# Patient Record
Sex: Male | Born: 1970 | Race: White | Hispanic: No | Marital: Single | Smoking: Current every day smoker
Health system: Southern US, Community
[De-identification: ages and names within clinical notes are randomized; demographics above are authoritative.]

## PROBLEM LIST (undated history)

## (undated) DIAGNOSIS — K429 Umbilical hernia without obstruction or gangrene: Secondary | ICD-10-CM

## (undated) HISTORY — DX: Umbilical hernia without obstruction or gangrene: K42.9

---
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
## 2009-07-10 ENCOUNTER — Telehealth: Payer: Self-pay | Admitting: Family Medicine

## 2009-08-26 ENCOUNTER — Telehealth: Payer: Self-pay | Admitting: Family Medicine

## 2009-08-26 ENCOUNTER — Encounter: Payer: Self-pay | Admitting: Family Medicine

## 2009-09-07 ENCOUNTER — Encounter (INDEPENDENT_AMBULATORY_CARE_PROVIDER_SITE_OTHER): Payer: Self-pay | Admitting: *Deleted

## 2009-09-07 ENCOUNTER — Ambulatory Visit: Payer: Self-pay | Admitting: Family Medicine

## 2009-09-07 DIAGNOSIS — F17200 Nicotine dependence, unspecified, uncomplicated: Secondary | ICD-10-CM

## 2009-09-07 DIAGNOSIS — K649 Unspecified hemorrhoids: Secondary | ICD-10-CM | POA: Insufficient documentation

## 2009-09-07 DIAGNOSIS — M549 Dorsalgia, unspecified: Secondary | ICD-10-CM | POA: Insufficient documentation

## 2009-09-09 LAB — CONVERTED CEMR LAB
BUN: 14 mg/dL (ref 6–23)
Basophils Absolute: 0.1 10*3/uL (ref 0.0–0.1)
Basophils Relative: 0.8 % (ref 0.0–3.0)
Bilirubin, Direct: 0.1 mg/dL (ref 0.0–0.3)
CO2: 32 meq/L (ref 19–32)
Chloride: 105 meq/L (ref 96–112)
Cholesterol: 203 mg/dL — ABNORMAL HIGH (ref 0–200)
Creatinine, Ser: 1 mg/dL (ref 0.4–1.5)
Direct LDL: 94.2 mg/dL
Eosinophils Absolute: 0.2 10*3/uL (ref 0.0–0.7)
Glucose, Bld: 92 mg/dL (ref 70–99)
MCHC: 35.2 g/dL (ref 30.0–36.0)
MCV: 92.5 fL (ref 78.0–100.0)
Monocytes Absolute: 0.7 10*3/uL (ref 0.1–1.0)
Neutrophils Relative %: 50 % (ref 43.0–77.0)
Platelets: 151 10*3/uL (ref 150.0–400.0)
RDW: 13.7 % (ref 11.5–14.6)
Total Bilirubin: 0.5 mg/dL (ref 0.3–1.2)
Total Protein: 6.5 g/dL (ref 6.0–8.3)

## 2010-05-04 NOTE — Assessment & Plan Note (Signed)
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 of borderline chol -- ? 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 if it helped  not really bothering him -- a little blood streak now and then  no abd pain or other symptoms  never had a colonoscopy they do pop out occasionally -- ? 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 last Td -- probably 10-11y  is interested in health mt  is due for DOT physical due in nov 2012   shoulder/ back stiffness  most of the time is mild  sometimes more of a problem  lifts heavy flatbeds  2 weeks ago coughed and pulled something in neck and back   not a lot of opportunity to stretch   smokes 1/2-1ppd usually not ready to quit  understands the risks   limitations to eating healthy on the road    Allergies (verified): No Known Drug Allergies  Past History:  Family History: Last updated: 09/07/2009 mother -- is Terald Sleeper father-  alcoholism  GF- several MI and strokes - older , and a type of blood cancer and skin cancer (had to have transfusions)- died  GM may have had cancer- ? 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 Eyes:  Denies blurring and eye irritation. CV:  Denies chest pain or discomfort, palpitations, shortness  of breath with exertion, and swelling of feet. Resp:  Denies cough, shortness of breath, and wheezing. GI:  Denies abdominal pain, change in bowel habits, indigestion, and nausea. GU:  Denies hematuria and urinary frequency. MS:  Complains of mid back pain; denies joint pain, joint redness, and joint swelling. Derm:  Denies itching, lesion(s), poor wound healing, and rash. Neuro:  Denies numbness and tingling. Psych:  Denies anxiety and depression. Endo:  Denies cold intolerance, excessive thirst, excessive urination, and heat intolerance. Heme:  Denies abnormal bruising.   Impression & Recommendations:  Problem # 1:  BACK PAIN (ICD-724.5) Assessment New with heavy lifting - intermittent disc stretches before / after driving  watch for numbness/ weakness or worsening   Problem # 2:  HEMORRHOIDS (ICD-455.6) Assessment: New not currently bothering him sounds like internal disc keeping stools soft- avoid straining    if flared - will call for px for anusol hc suppositories- these may be easier to use than cream  f/u prn  Problem # 3:  Preventive Health Care (ICD-V70.0) labs now for PE in nov  Td today  Problem # 4:  TOBACCO USE (ICD-305.1) Assessment: New discussed in detail risks of smoking, and possible outcomes including COPD, vascular dz, cancer and also respiratory infections/sinus problems  pt voiced understanding  he stated not ready to quit yet  knows he can ask for help  Other Orders: Venipuncture (04540) TLB-Lipid Panel (80061-LIPID) TLB-BMP (Basic Metabolic Panel-BMET) (80048-METABOL) TLB-CBC Platelet - w/Differential (85025-CBCD) TLB-Hepatic/Liver Function Pnl (80076-HEPATIC) TLB-TSH (Thyroid Stimulating Hormone) (84443-TSH) TD Toxoids IM 7 YR + (98119) Admin 1st Vaccine (14782) Admin 1st Vaccine Horticulturist, commercial) [phone])  Patient Instructions: 1)  think about quitting smoking  2)  tetnus shot today  3)  labs today for wellness and cholesterol  4)  schedule physical exam in november 5)  let me know if hemorroids worsen  6)  you can raise your HDL (good cholesterol) by increasing exercise and eating omega 3 fatty acid supplement like fish oil or flax seed oil over the counter 7)  you can lower LDL (bad cholesterol) by limiting saturated fats in diet like red meat, fried foods, egg yolks, fatty breakfast meats, high fat dairy products and shellfish  8)  I will look for some back stretching protocol and mail it to you   Prior Medications: None Current Allergies (reviewed today): No known allergies    Tetanus/Td Vaccine    Vaccine Type: Td    Site: left deltoid    Mfr: Sanofi Pasteur    Dose: 0.5 ml    Route: IM    Given by: Lowella Petties CMA    Exp. Date: 02/17/2011    Lot #: [id number]    VIS given: 02/20/07 version given September 07, 2009.   Appended Document: NEW PT TO ESTABLISH WITH DR Kortney Schoenfelder PER DR Lillien Petronio/[place]     Allergies: No Known Drug Allergies  Physical  Exam  General:  overweight but generally well appearing  Head:  normocephalic, atraumatic, and no abnormalities observed.   Eyes:  vision grossly intact, pupils equal, pupils round, and pupils reactive to light.   Ears:  R ear normal and L ear normal.  - scant cerumen Nose:  no nasal discharge.   Mouth:  pharynx pink and moist.   Neck:  supple with full rom and no masses or thyromegally, no JVD or carotid bruit  Chest Wall:  No deformities, masses, tenderness or gynecomastia noted. Lungs:  mildly distant bs no rales or wheeze  Abdomen:  Bowel sounds positive,abdomen soft and non-tender without masses, organomegaly or hernias noted. Msk:  no neck or spinal tenderness today some spasm in bilat trap area  nl rom neck - no crepitice  no pain around either scapula  nl rom TS  pt is asymptomatic today Pulses:  R and L carotid,radial,femoral,dorsalis pedis and posterior tibial pulses are full and equal bilaterally Extremities:  No clubbing, cyanosis, edema, or deformity noted with normal full range of motion of all joints.   Neurologic:  strength normal in all extremities, sensation intact to light touch, gait normal, and DTRs symmetrical and normal.   Skin:  Intact without suspicious lesions or rashes Cervical Nodes:  No lymphadenopathy noted Psych:  normal affect, talkative and pleasant

## 2010-05-04 NOTE — Progress Notes (Signed)
Summary: Question about new pt appt  Phone Note Call from Patient Call back at [phone] or [phone]   Caller: Spouse Call For: Dr Milinda Antis Summary of Call: Pt's wife said she spoke with you and you agreed to see her husband who is a truck driver with a limited schedule.  Pt's wife wanted to confirm that you would see her husband as a new pt on a Monday. Please advise.  Initial call taken by: Lewanda Rife LPN,  July 10, 2009 3:21 PM  Follow-up for Phone Call        any 30 minutes you can put together is fine  Follow-up by: Judith Part MD,  July 10, 2009 3:43 PM  Additional Follow-up for Phone Call Additional follow up Details #1::        Patient's wife  notified as instructed by telephone.  Pt's wife made appt for 09/07/09 at 10:30am.[facility] LPN  July 11, 8411 4:47 PM

## 2010-05-04 NOTE — Progress Notes (Signed)
Summary: needs not for work  Phone Note Call from Patient Call back at [employer] [phone]   Caller: Patient Call For: Dr. 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 He has very limited time off and his work is requiring that he have a note faxed to his work office stating that he has an appt. on that day to guarantee him the time off. His work fax # is [phone]. The note needs to have the date and time of appt.  Initial call taken by: Melody Comas,  Aug 26, 2009 10:39 AM  Follow-up for Phone Call        note done  Follow-up by: Judith Part MD,  Aug 26, 2009 11:29 AM  Additional Follow-up for Phone Call Additional follow up Details #1::        Letter faxed to [phone] as instructed. Left message for patient to call back. Lewanda Rife LPN  Aug 26, 2009 2:40 PM   Patient's wife notified as instructed by telephone. Lewanda Rife LPN  Aug 26, 2009 4:25 PM

## 2010-05-04 NOTE — Letter (Signed)
Summary: generic letter  [facility]  [address] [place], [place] [address]   Phone: [phone]  Fax: [phone]               09/07/2009  RYZEN DEADY [address] [place], [place]  [address]    Dear, Mr. Olden,  We have scheduled your next appointment with Dr. Roxy Manns for Monday, February 08, 2010 at 10:30am.  Please arrive 15 minutes prior to your appointment time.       Sincerely, Daine Gip

## 2010-05-04 NOTE — Letter (Signed)
Summary: Out of Work  [employer] at [facility]  [address] [place], [place] [address]   Phone: [phone]  Fax: [phone]    Aug 26, 2009   Employee:  Nicholas Hampton    To Whom It May Concern:   For Medical reasons, please excuse the above named employee from work for the following dates:  Start:   September 07, 2009   End:   pt has doctor appt with Zhavia Cunanan at 10:30 am that day  he can return to work after appt   If you need additional information, please feel free to contact our office.         Sincerely,    Judith Part MD

## 2012-04-04 DIAGNOSIS — K429 Umbilical hernia without obstruction or gangrene: Secondary | ICD-10-CM

## 2012-04-04 HISTORY — PX: HERNIA REPAIR: SHX51

## 2012-04-04 HISTORY — DX: Umbilical hernia without obstruction or gangrene: K42.9

## 2012-12-24 ENCOUNTER — Encounter: Payer: Self-pay | Admitting: *Deleted

## 2013-01-09 ENCOUNTER — Ambulatory Visit (INDEPENDENT_AMBULATORY_CARE_PROVIDER_SITE_OTHER): Payer: BC Managed Care – PPO | Admitting: General Surgery

## 2013-01-09 ENCOUNTER — Encounter: Payer: Self-pay | Admitting: General Surgery

## 2013-01-09 VITALS — BP 140/100 | HR 78 | Resp 18 | Ht 69.0 in | Wt 265.0 lb

## 2013-01-09 DIAGNOSIS — K429 Umbilical hernia without obstruction or gangrene: Secondary | ICD-10-CM

## 2013-01-09 NOTE — Patient Instructions (Addendum)
Hernia, Surgical Repair A hernia occurs when an internal organ pushes out through a weak spot in the belly (abdominal) wall muscles. Hernias commonly occur in the groin and around the navel. Hernias often can be pushed back into place (reduced). Most hernias tend to get worse over time. Problems occur when abdominal contents get stuck in the opening (incarcerated hernia). The blood supply gets cut off (strangulated hernia). This is an emergency and needs surgery. Otherwise, hernia repair can be an elective procedure. This means you can schedule this at your convenience when an emergency is not present. Because complications can occur, if you decide to repair the hernia, it is best to do it soon. When it becomes an emergency procedure, there is increased risk of complications after surgery. CAUSES   Heavy lifting.  Obesity.  Prolonged coughing.  Straining to move your bowels.  Hernias can also occur through a cut (incision) by a surgeonafter an abdominal operation. HOME CARE INSTRUCTIONS Before the repair:  Bed rest is not required. You may continue your normal activities, but avoid heavy lifting (more than 10 pounds) or straining. Cough gently. If you are a smoker, it is best to stop. Even the best hernia repair can break down with the continual strain of coughing.  Do not wear anything tight over your hernia. Do not try to keep it in with an outside bandage or truss. These can damage abdominal contents if they are trapped in the hernia sac.  Eat a normal diet. Avoid constipation. Straining over long periods of time to have a bowel movement will increase hernia size. It also can breakdown repairs. If you cannot do this with diet alone, laxatives or stool softeners may be used. PRIOR TO SURGERY, SEEK IMMEDIATE MEDICAL CARE IF: You have problems (symptoms) of a trapped (incarcerated) hernia. Symptoms include:  An oral temperature above 102 F (38.9 C) develops, or as your caregiver  suggests.  Increasing abdominal pain.  Feeling sick to your stomach(nausea) and vomiting.  You stop passing gas or stool.  The hernia is stuck outside the abdomen, looks discolored, feels hard, or is tender.  You have any changes in your bowel habits or in the hernia that is unusual for you. LET YOUR CAREGIVERS KNOW ABOUT THE FOLLOWING:  Allergies.  Medications taken including herbs, eye drops, over the counter medications, and creams.  Use of steroids (by mouth or creams).  Family or personal history of problems with anesthetics or Novocaine.  Possibility of pregnancy, if this applies.  Personal history of blood clots (thrombophlebitis).  Family or personal history of bleeding or blood problems.  Previous surgery.  Other health problems. BEFORE THE PROCEDURE You should be present 1 hour prior to your procedure, or as directed by your caregiver.  AFTER THE PROCEDURE After surgery, you will be taken to the recovery area. A nurse will watch and check your progress there. Once you are awake, stable, and taking fluids well, you will be allowed to go home as long as there are no problems. Once home, an ice pack (wrapped in a light towel) applied to your operative site may help with discomfort. It may also keep the swelling down. Do not lift anything heavier than 10 pounds (4.55 kilograms). Take showers not baths. Do not drive while taking narcotics. Follow instructions as suggested by your caregiver.  SEEK IMMEDIATE MEDICAL CARE IF: After surgery:  There is redness, swelling, or increasing pain in the wound.  There is pus coming from the wound.  There is  drainage from a wound lasting longer than 1 day.  An unexplained oral temperature above 102 F (38.9 C) develops.  You notice a foul smell coming from the wound or dressing.  There is a breaking open of a wound (edged not staying together) after the sutures have been removed.  You notice increasing pain in the shoulders  (shoulder strap areas).  You develop dizzy episodes or fainting while standing.  You develop persistent nausea or vomiting.  You develop a rash.  You have difficulty breathing.  You develop any reaction or side effects to medications given. MAKE SURE YOU:   Understand these instructions.  Will watch your condition.  Will get help right away if you are not doing well or get worse. Document Released: 09/14/2000 Document Revised: 06/13/2011 Document Reviewed: 08/07/2007 [facility] Patient Information 2014 [place], [place].  Patient's surgery has been scheduled for 01-14-13 at [facility].

## 2013-01-09 NOTE — Progress Notes (Signed)
Patient ID: Nicholas Hampton, male   DOB: 02/23/1971, 42 y.o.   MRN: [id number]  Chief Complaint  Patient presents with  . Other    umbilical hernia    HPI Nicholas Hampton is a 42 y.o. male here today for an evaluation of an umbilical hernia. Patient noticed this area about 5 months ago. He states it is not getting significantly bigger. No difficulties with daily activities. No known trauma.  HPI  History reviewed. No pertinent past medical history.  Past Surgical History  Procedure Laterality Date  . Appendectomy  1990    No family history on file.  Social History History  Substance Use Topics  . Smoking status: Current Every Day Smoker -- 1.00 packs/day for 26 years  . Smokeless tobacco: Never Used  . Alcohol Use: Yes    No Known Allergies  No current outpatient prescriptions on file.   No current facility-administered medications for this visit.    Review of Systems Review of Systems  Constitutional: Negative.   Respiratory: Negative.   Cardiovascular: Negative.     Blood pressure 140/100, pulse 78, resp. rate 18, height 5\' 9"  (1.753 m), weight 265 lb (120.203 kg).  Physical Exam Physical Exam  Constitutional: He is oriented to person, place, and time. He appears well-developed and well-nourished.  Eyes: No scleral icterus.  Cardiovascular: Normal rate, regular rhythm and normal heart sounds.   Pulmonary/Chest: Breath sounds normal.  Abdominal: Soft. Normal appearance and bowel sounds are normal. There is no tenderness. A hernia (umbilical hernia ) is present.  Reducible umbilical hernia  1.5 cm fascial defect.    Lymphadenopathy:    He has no cervical adenopathy.  Neurological: He is alert and oriented to person, place, and time.  Skin: Skin is warm and dry.    Data Reviewed None   Assessment    Umbilical hernia    Plan    Considering the patient's young age and strenuous activity, elective repair is been recommended. This will likely be  a primary repair, although the possibility of prosthetic mesh utilization was reviewed.    Patient's surgery has been scheduled for 01-14-13 at [facility].   Earline Mayotte 01/10/2013, 1:10 PM

## 2013-01-10 ENCOUNTER — Other Ambulatory Visit: Payer: Self-pay | Admitting: General Surgery

## 2013-01-10 ENCOUNTER — Encounter: Payer: Self-pay | Admitting: General Surgery

## 2013-01-10 DIAGNOSIS — K429 Umbilical hernia without obstruction or gangrene: Secondary | ICD-10-CM

## 2013-01-14 ENCOUNTER — Ambulatory Visit: Payer: Self-pay | Admitting: General Surgery

## 2013-01-14 DIAGNOSIS — K429 Umbilical hernia without obstruction or gangrene: Secondary | ICD-10-CM

## 2013-01-15 ENCOUNTER — Encounter: Payer: Self-pay | Admitting: General Surgery

## 2013-01-15 ENCOUNTER — Telehealth: Payer: Self-pay | Admitting: *Deleted

## 2013-01-15 NOTE — Telephone Encounter (Signed)
Pt called and said he had hernia surgery yesterday and just wanted to ask a question about dressing change.

## 2013-01-15 NOTE — Telephone Encounter (Signed)
Discussed dressing care, pt agrees.

## 2013-01-23 ENCOUNTER — Encounter: Payer: Self-pay | Admitting: General Surgery

## 2013-01-23 ENCOUNTER — Ambulatory Visit (INDEPENDENT_AMBULATORY_CARE_PROVIDER_SITE_OTHER): Payer: BC Managed Care – PPO | Admitting: General Surgery

## 2013-01-23 VITALS — BP 140/70 | Ht 69.0 in | Wt 266.0 lb

## 2013-01-23 DIAGNOSIS — K429 Umbilical hernia without obstruction or gangrene: Secondary | ICD-10-CM

## 2013-01-23 NOTE — Progress Notes (Signed)
Patient ID: Nicholas Hampton, male   DOB: 10-15-1970, 42 y.o.   MRN: [id number]  Chief Complaint  Patient presents with  . Routine Post Op    umbilical henira    HPI Nicholas Hampton is a 42 y.o. male here today for his post op umbilical hernia repair done 01/15/13. Patient state he is doing well.  HPI  Past Medical History  Diagnosis Date  . Umbilical hernia 2014    Past Surgical History  Procedure Laterality Date  . Appendectomy  1990  . Hernia repair  2014    umbical     History reviewed. No pertinent family history.  Social History History  Substance Use Topics  . Smoking status: Current Every Day Smoker -- 1.00 packs/day for 26 years  . Smokeless tobacco: Never Used  . Alcohol Use: Yes    No Known Allergies  Current Outpatient Prescriptions  Medication Sig Dispense Refill  . HYDROcodone-acetaminophen (NORCO/VICODIN) 5-325 MG per tablet Take 1 tablet by mouth as needed.       No current facility-administered medications for this visit.    Review of Systems Review of Systems  Constitutional: Negative.   Respiratory: Negative.   Cardiovascular: Negative.     Blood pressure 140/70, height 5\' 9"  (1.753 m), weight 266 lb (120.657 kg).  Physical Exam Physical Exam  Constitutional: He is oriented to person, place, and time. He appears well-developed and well-nourished.  Abdominal:  Well healing incision site.   Genitourinary: Rectal exam shows external hemorrhoid.  Neurological: He is alert and oriented to person, place, and time.  Skin: Skin is warm and dry.    Data Reviewed None.  Assessment    Good recovery post umbilical hernia repair. External hemorrhoids.     Plan    Topical Anusol cream was recommended in regards to hemorrhoids. 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 We'll plan for reassessment in 3 weeks and anticipated to return to work  at that time. Neosporin to be applied to the wound to keep the area soft.Nicholas Hampton 01/23/2013, 9:28 PM

## 2013-01-23 NOTE — Patient Instructions (Addendum)
Patient to return in 3 weeks. The patient is advised to use preparation h for hemorrhoids.

## 2013-02-07 ENCOUNTER — Other Ambulatory Visit: Payer: Self-pay

## 2013-02-14 ENCOUNTER — Ambulatory Visit (INDEPENDENT_AMBULATORY_CARE_PROVIDER_SITE_OTHER): Payer: BC Managed Care – PPO | Admitting: General Surgery

## 2013-02-14 VITALS — BP 138/72 | HR 66 | Resp 14 | Ht 69.0 in | Wt 272.0 lb

## 2013-02-14 DIAGNOSIS — K429 Umbilical hernia without obstruction or gangrene: Secondary | ICD-10-CM

## 2013-02-14 NOTE — Patient Instructions (Signed)
Proper lifting techniques reviewed. Patient to return as needed.  

## 2013-02-14 NOTE — Progress Notes (Signed)
Patient ID: Nicholas Hampton, male   DOB: [birth year], 42 y.o.   MRN: [id number]  Chief Complaint  Patient presents with  . Routine Post Op    umbilical hernia repair    HPI Nicholas Hampton is a 42 y.o. male here today for his post op umbilical hernia repair done on 01/15/13. Patient state he is still having pain above the umbilical area, when he pushes on it the pain goes away. 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 These may way up to 100 pounds.  The area of tendernessIs just superior to the umbilical skin fold.  HPI  Past Medical History  Diagnosis Date  . Umbilical hernia 2014    Past Surgical History  Procedure Laterality Date  . Appendectomy  1990  . Hernia repair  2014    umbical     No family history on file.  Social History History  Substance Use Topics  . Smoking status: Current Every Day Smoker -- 1.00 packs/day for 26 years  . Smokeless tobacco: Never Used  . Alcohol Use: Yes    No Known Allergies  Current Outpatient Prescriptions  Medication Sig Dispense Refill  . HYDROcodone-acetaminophen (NORCO/VICODIN) 5-325 MG per tablet Take 1 tablet by mouth as needed.       No current facility-administered medications for this visit.    Review of Systems Review of Systems  Constitutional: Negative.   Respiratory: Negative.   Cardiovascular: Negative.     Blood pressure 138/72, pulse 66, resp. rate 14, height 5\' 9"  (1.753 m), weight 272 lb (123.378 kg).  Physical Exam Physical Exam  Constitutional: He is oriented to person, place, and time. He appears well-developed and well-nourished.  Eyes: No scleral icterus.  Cardiovascular: Normal rate, regular rhythm and normal heart sounds.   Abdominal: Soft. Normal appearance and bowel sounds are normal.  Umbilical area is healing well.   Lymphadenopathy:    He has no cervical adenopathy.  Neurological: He is alert and oriented to  person, place, and time.  Skin: Skin is warm and dry.  Area of tenderness is along the suture line from the primary repair. No induration, thickening or evidence of infection.  Data Reviewed None.  Assessment    Doing well status post umbilical hernia repair.     Plan    Considering the strenuous lifting required as part of his routine job responsibilities, we'll plan on him working on improving his endurance and flexibility prior to returning to work. I anticipate he'll be able to return to work the first week of December.        Nicholas Hampton 02/14/2013, 9:22 PM

## 2013-09-27 ENCOUNTER — Ambulatory Visit: Payer: Self-pay

## 2014-06-22 ENCOUNTER — Ambulatory Visit: Payer: Self-pay | Admitting: Emergency Medicine

## 2014-07-25 NOTE — Op Note (Signed)
PATIENT NAME:  Nicholas BoydenQUAKENBUSH, Dyon S MR#:  [id number] DATE OF BIRTH:  12-Jan-1971  DATE OF PROCEDURE:  01/15/2013  PREOPERATIVE DIAGNOSIS: Umbilical hernia.   POSTOPERATIVE DIAGNOSIS: Umbilical hernia.   OPERATIVE PROCEDURE: Repair of umbilical hernia.   SURGEON: Donnalee CurryJeffrey Meriah Shands, MD   ANESTHESIA: General by LMA, under Dr. Pernell DupreAdams, Marcaine 0.5% plain, 30 mL local infiltration, Toradol 30 mg.   ESTIMATED BLOOD LOSS: Minimal.   CLINICAL NOTE: This 44 year old male has developed a symptomatic and enlarging umbilical hernia. Fascial defect was estimated at less than 2 cm. He was felt to be a candidate for elective repair.   OPERATIVE NOTE: With the patient under adequate general anesthesia and hair previously removed with clippers, the abdomen was prepped with ChloraPrep and draped. He had previously received Kefzol intravenously in the event mesh repair was required. The infraumbilical skin was incised sharply and the remaining dissection completed with sharp dissection, electrocautery. The hernia sac was dissected free of the overlying skin, excised at the fascial level and discarded. The undersurface of the fascia was cleared, and interrupted 0 Surgilon sutures were placed under direct vision and subsequently tied sequentially. The umbilical skin was tacked to the fascia with 3-0 Vicryl. The adipose layer was approximated with a running 3-0 Vicryl. The skin was closed with running 4-0 Vicryl subcuticular suture. Benzoin, Steri-Strips, Telfa, Tegaderm dressings were applied.   The patient tolerated the procedure well and was taken to the recovery room in stable condition.  ____________________________ Earline MayotteJeffrey W. Karstyn Birkey, MD jwb:cg D: 01/15/2013 07:29:18 ET T: 01/15/2013 07:44:06 ET JOB#: [id number]  cc: Earline MayotteJeffrey W. Kriston Pasquarello, MD, <Dictator> Canisha Issac Brion AlimentW Baldwin Racicot MD ELECTRONICALLY SIGNED 01/16/2013 8:41

## 2014-10-25 IMAGING — CR DG KNEE COMPLETE 4+V*L*
4 series · 4 of 4 positions shown · non-contrast
Comparison: None.

CLINICAL DATA: Knee and hip pain.

EXAM:
LEFT KNEE - COMPLETE 4+ VIEW

[knee ap]
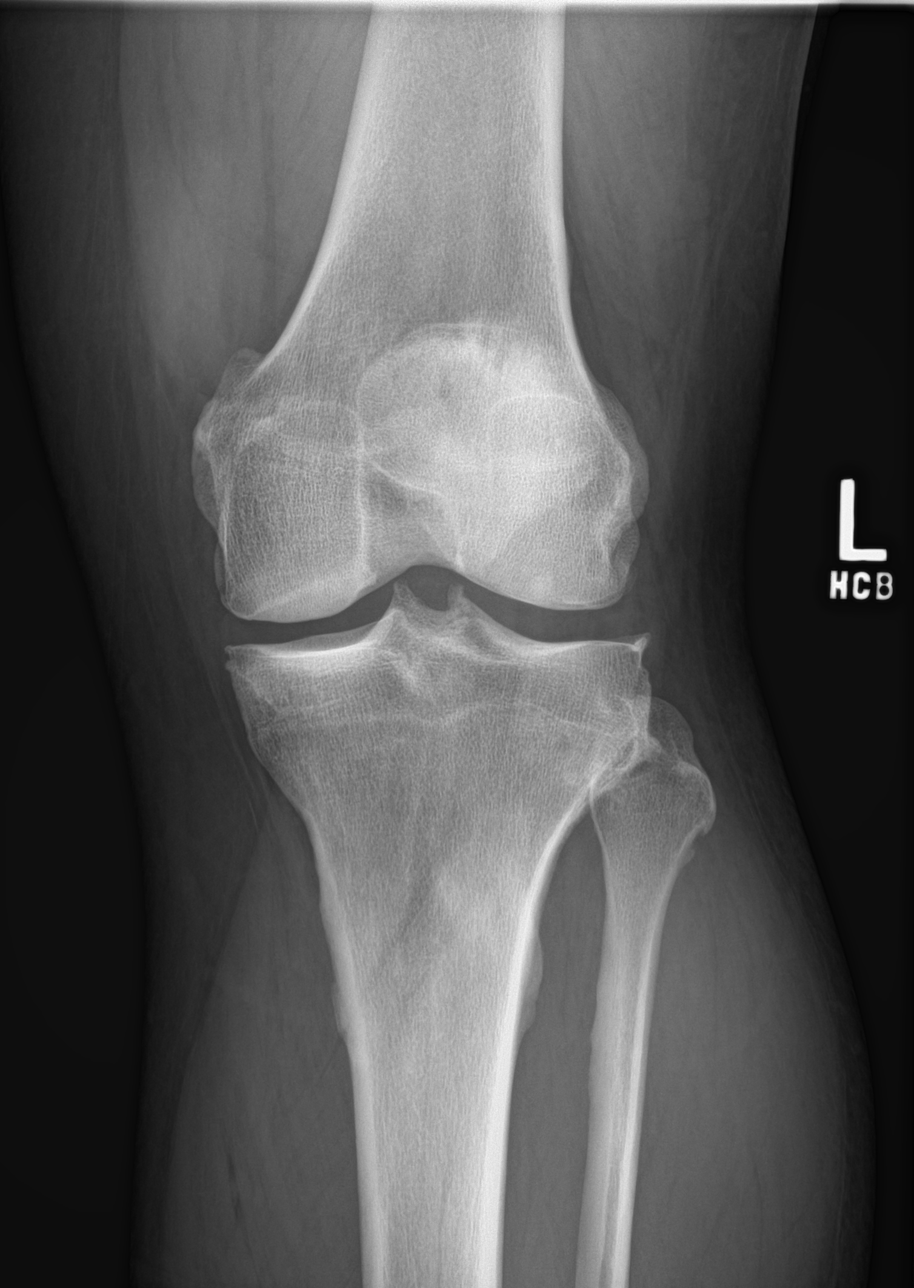

[knee obl (1 of 2)]
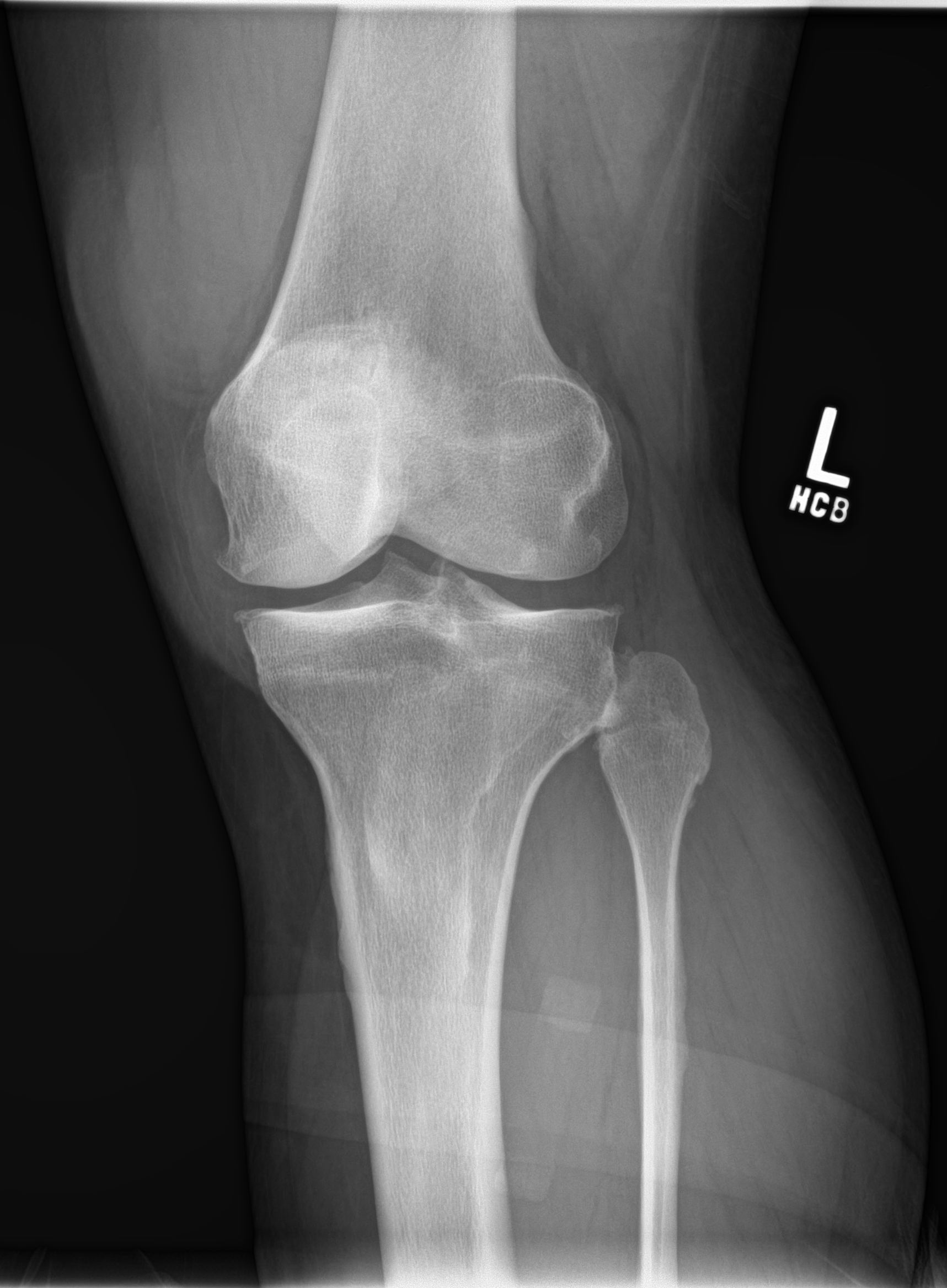

[knee obl (2 of 2)]
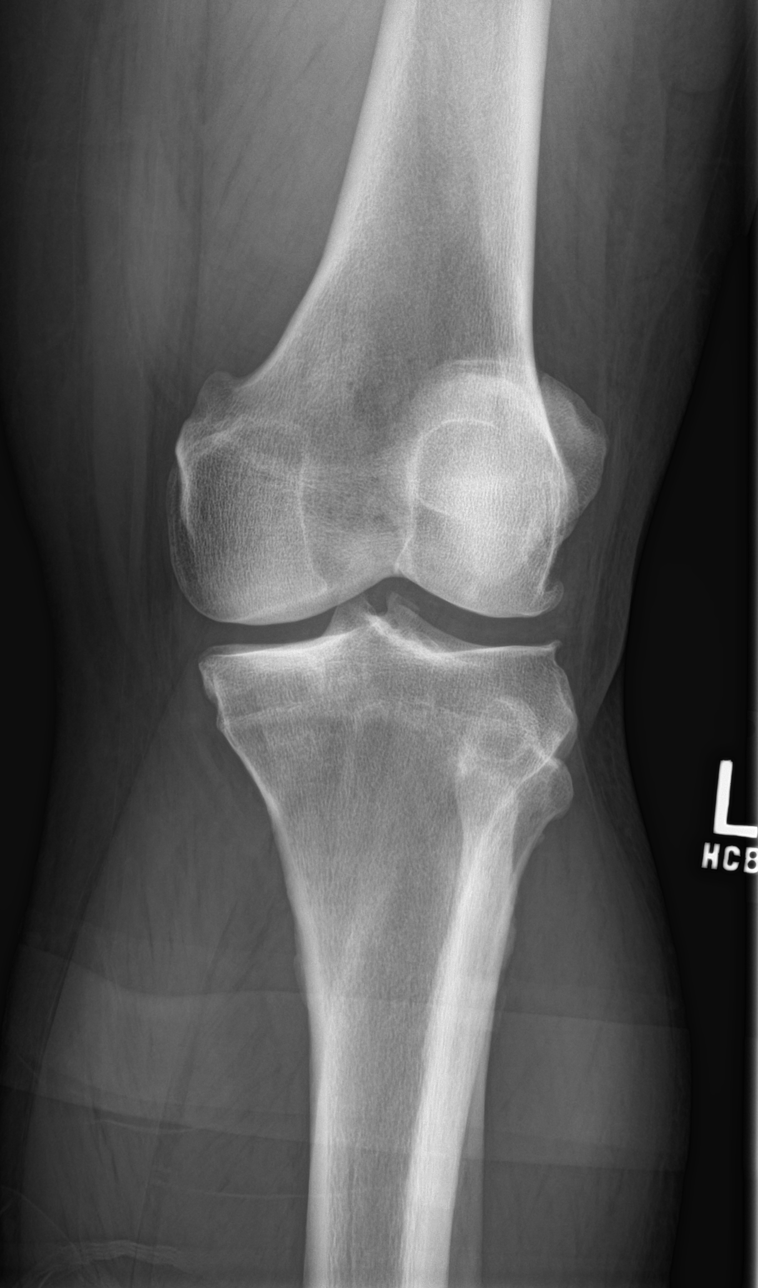

[knee lat]
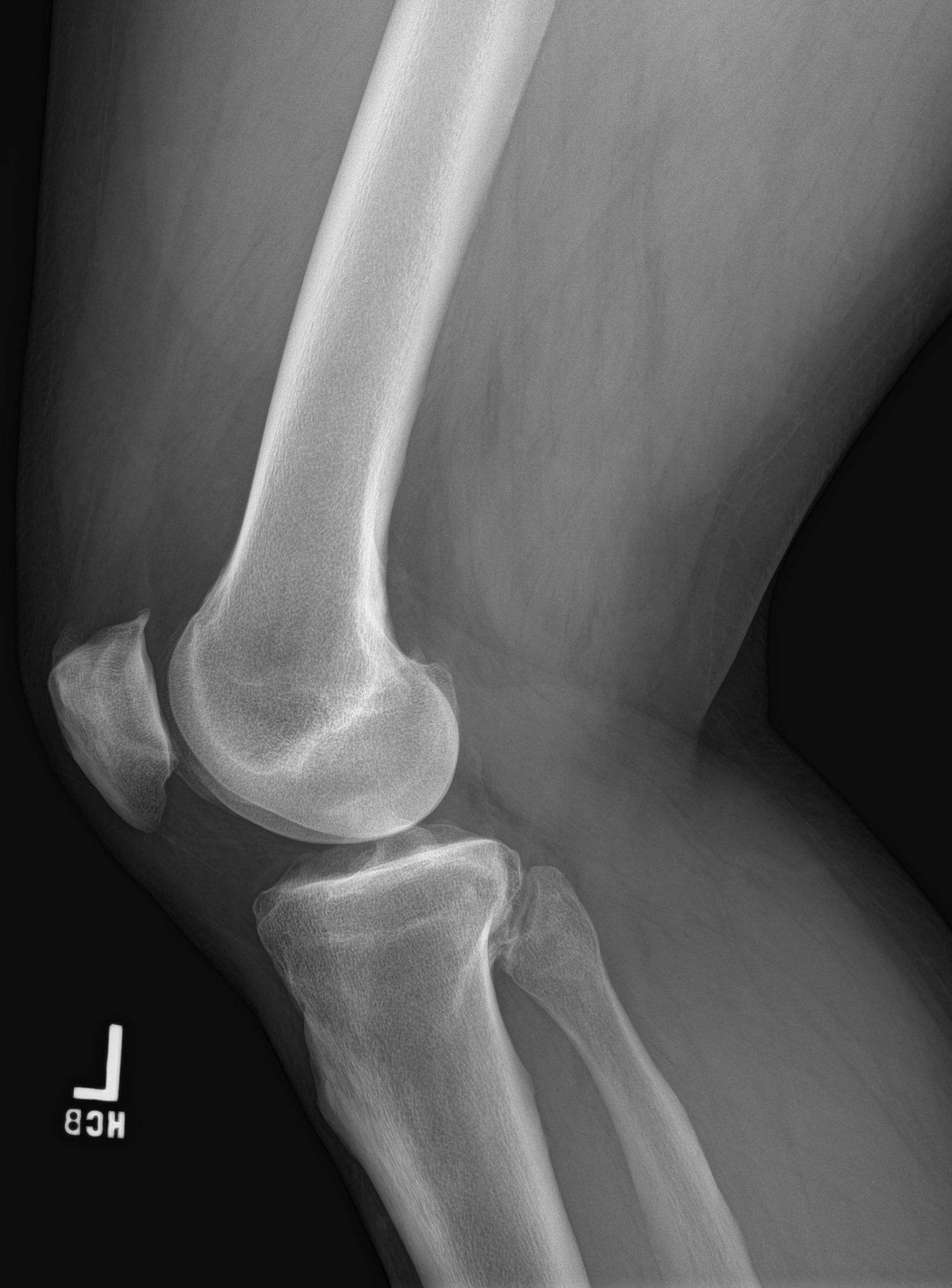

[4 of 4 positions shown; findings below may reference images not displayed]

FINDINGS: Mild tricompartment osteophytosis with peaking of the tibial spines.
No joint effusion or fracture.
IMPRESSION: Mild osteoarthritis.

## 2014-10-25 IMAGING — CR DG HIP COMPLETE 2+V*L*
3 series · 3 of 3 positions shown · non-contrast
Comparison: None.

CLINICAL DATA: Knee and hip pain.

EXAM:
LEFT HIP - COMPLETE 2+ VIEW

[pelvis ap]
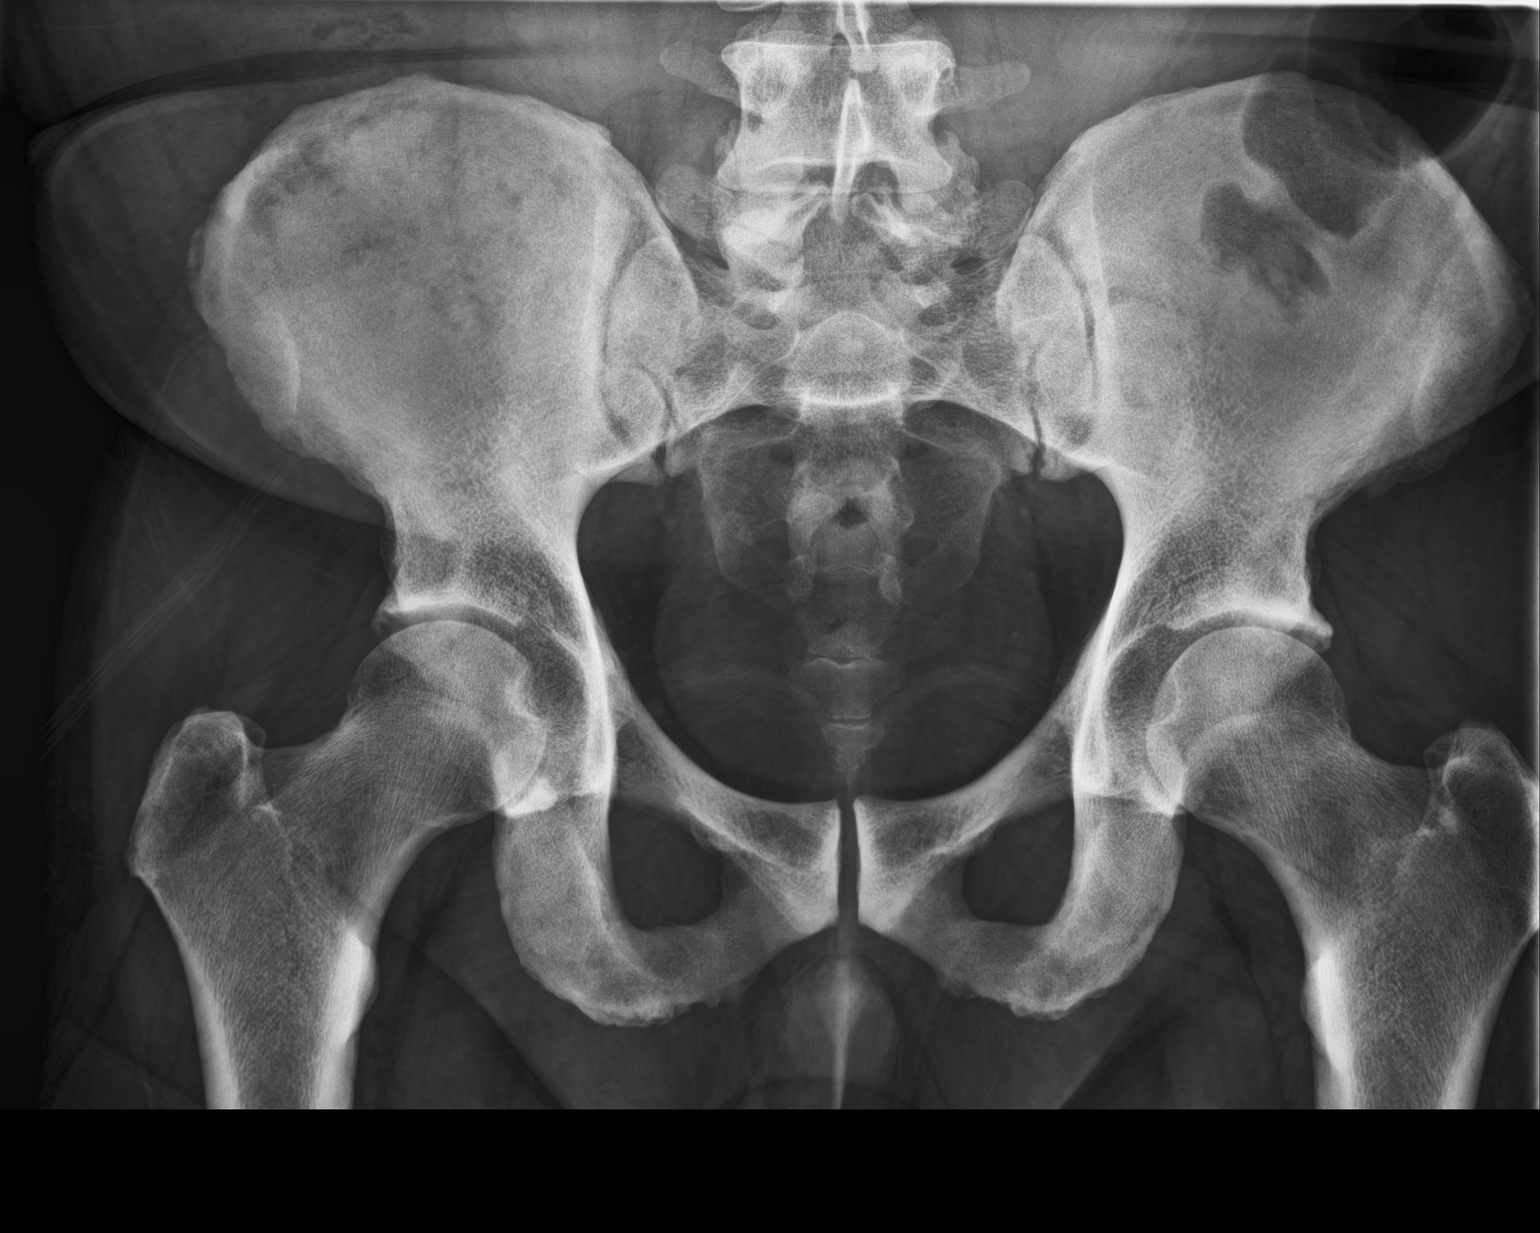

[hip ap]
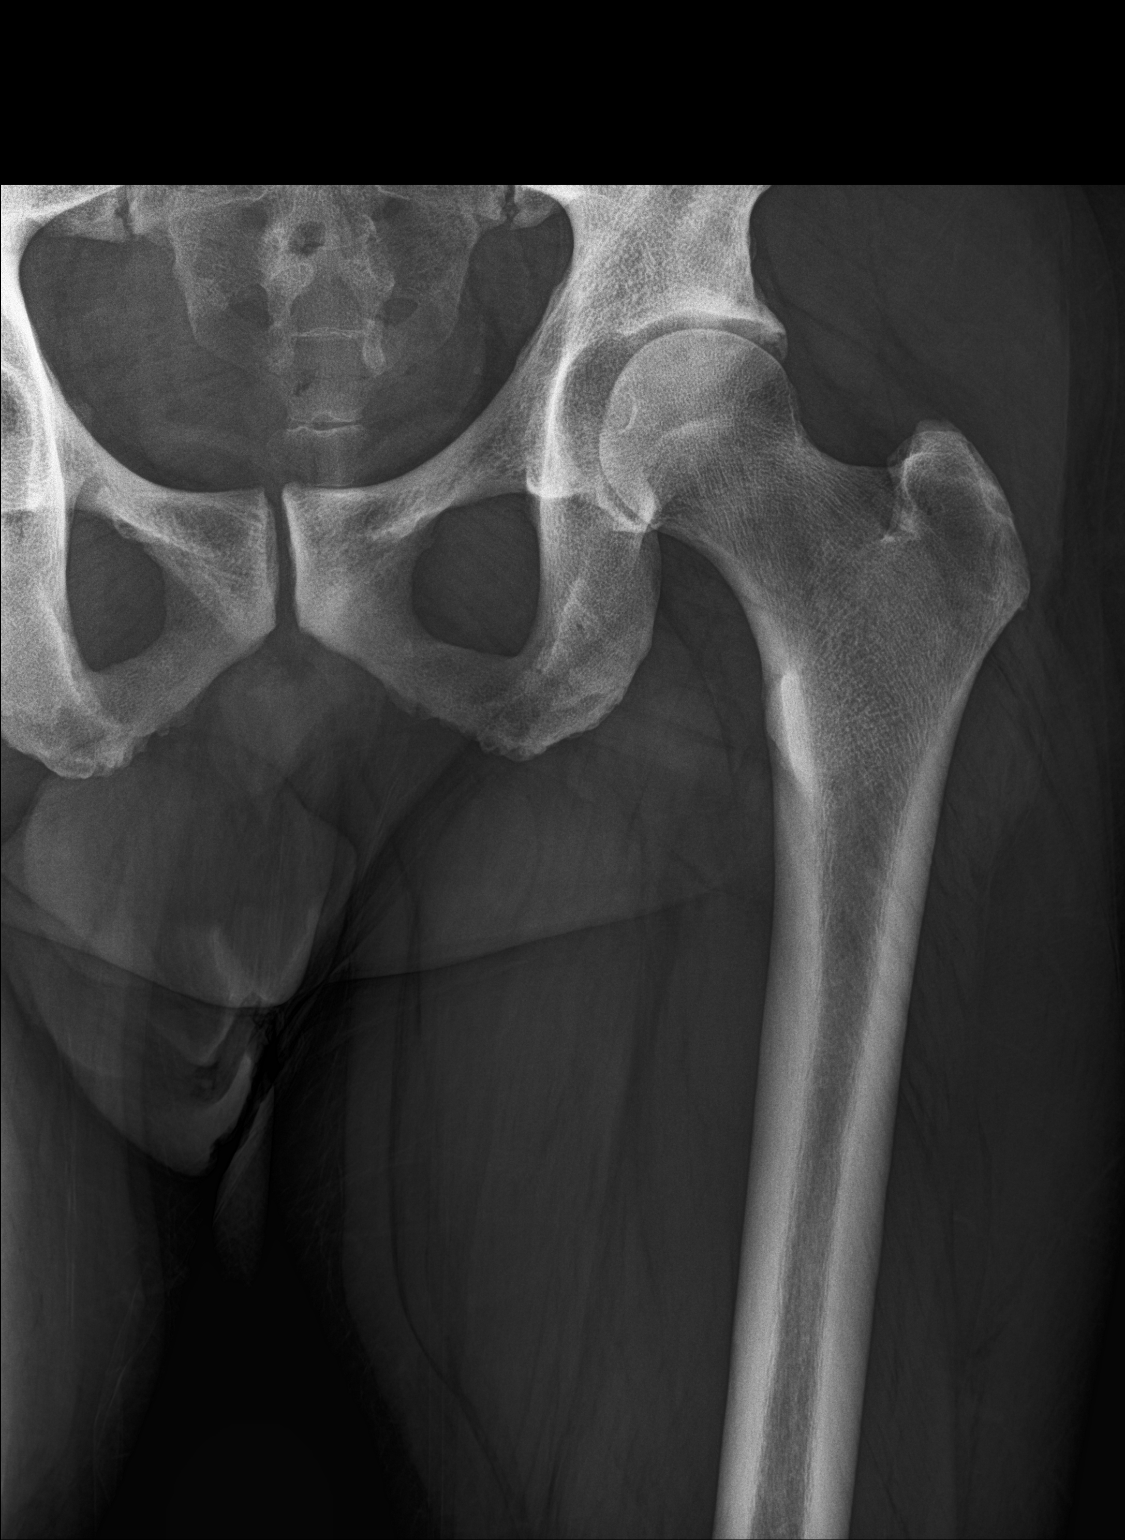

[hip lat]
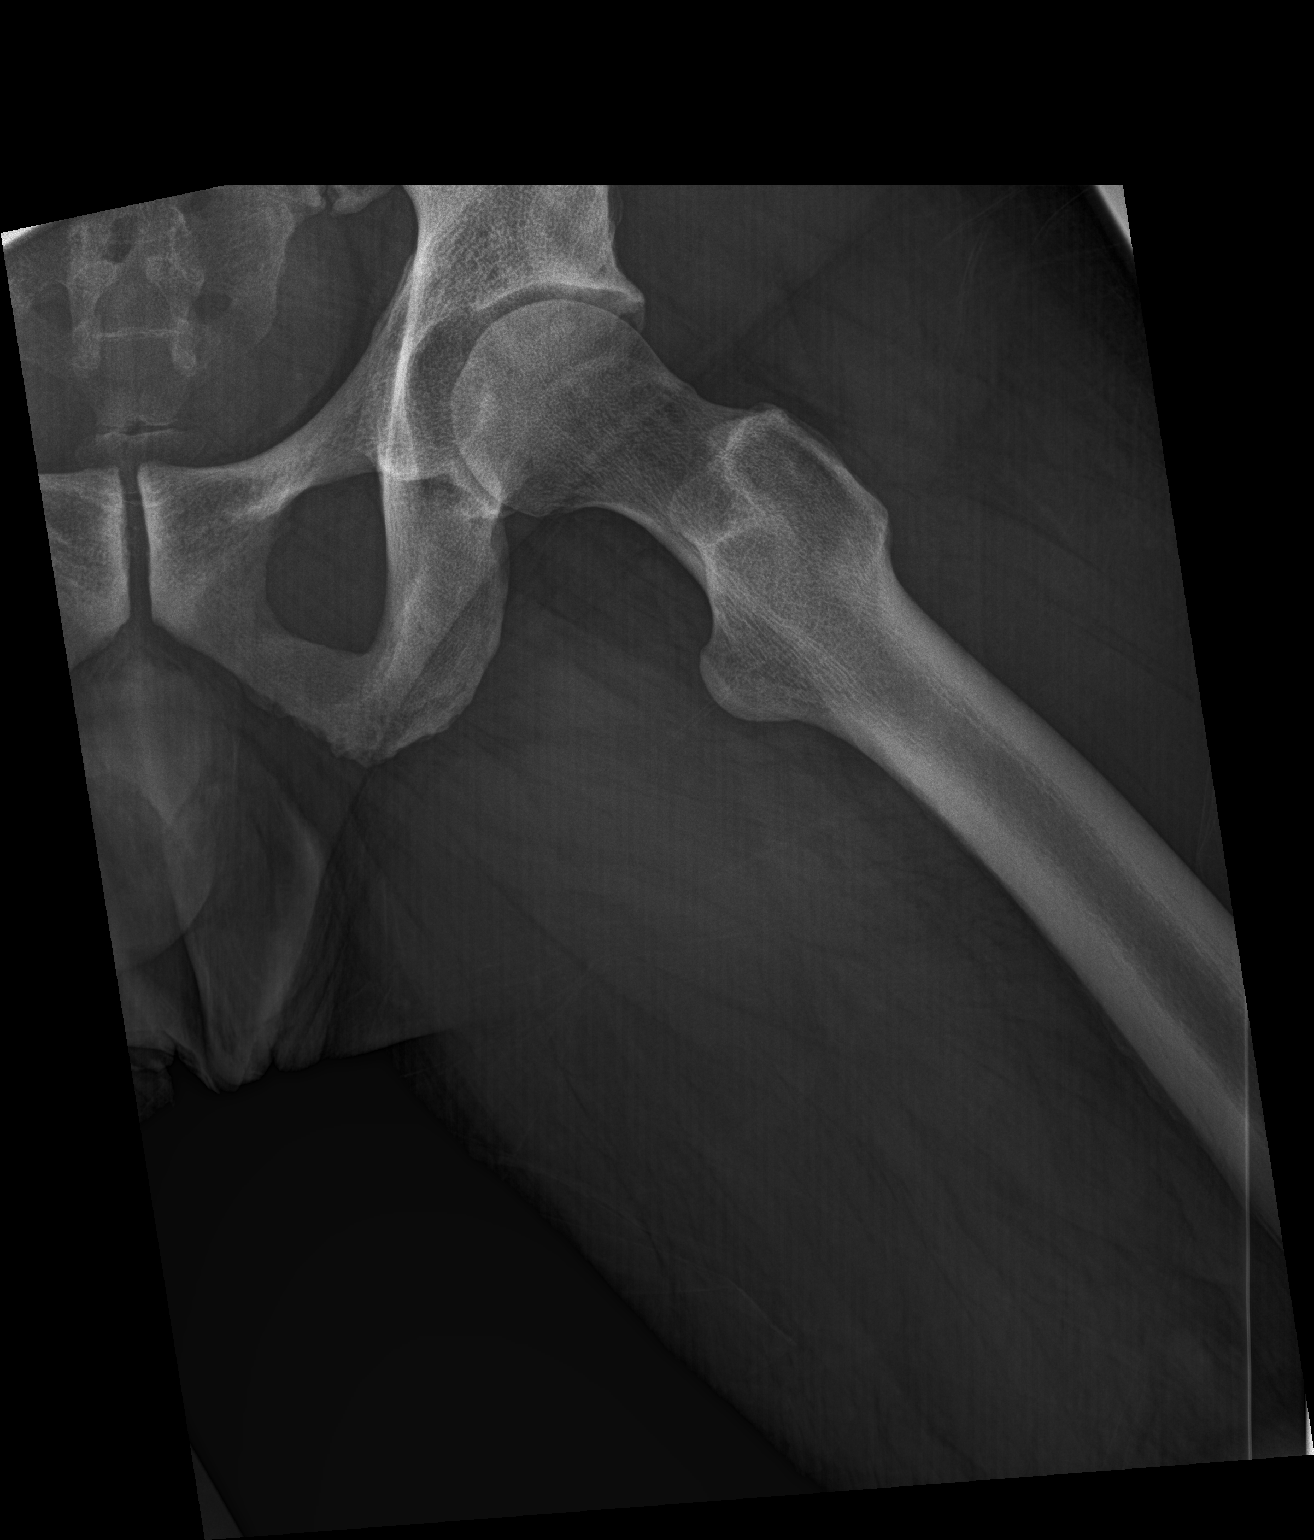

[3 of 3 positions shown; findings below may reference images not displayed]

FINDINGS: No acute osseous abnormality.
IMPRESSION: No acute osseous abnormality.

## 2015-01-25 ENCOUNTER — Ambulatory Visit
Admission: EM | Admit: 2015-01-25 | Discharge: 2015-01-25 | Disposition: A | Discharge status: Home / Self Care | Payer: BLUE CROSS/BLUE SHIELD | Attending: Family Medicine | Admitting: Family Medicine

## 2015-01-25 DIAGNOSIS — K047 Periapical abscess without sinus: Secondary | ICD-10-CM

## 2015-01-25 DIAGNOSIS — K0889 Other specified disorders of teeth and supporting structures: Secondary | ICD-10-CM

## 2015-01-25 MED ORDER — PENICILLIN V POTASSIUM 500 MG PO TABS: 500.0000 mg | ORAL_TABLET | Freq: Four times a day (QID) | ORAL | Status: DC

## 2015-01-25 NOTE — ED Notes (Signed)
Started 1 1/2 weeks ago with sinus drainage. This last Wednesday with pain right cheek/lower jaw. Thursday states right side of face was "grapefruit" size. Decreased now in size and in much less pain. Denies post nasal drainage and denies ear pain

## 2015-01-25 NOTE — ED Provider Notes (Signed)
[facility] Emergency Department Provider Note  ____________________________________________  Time seen: Approximately 3:19 PM  I have reviewed the triage vital signs and the nursing notes.   HISTORY  Chief Complaint Facial Pain   HPI Nicholas Hampton is a 44 y.o. male presents with spouse at the bedside for the complaints of right lower tooth pain. Patient reports that this tooth pain is been present 1.5 weeks. Patient reports that earlier this week he had right facial swelling and states that the face was swollen to the size of a "grapefruit. "Patient states that he thinks that the area drained that he did not realize at the time that it was straining. Patient states he had some runny nose earlier in the week. States no longer with runny nose or congestion.   Denies nasal congestion, cough, sore throat, chest pain, shortness of breath, or fever. Reports continues to eat and drink well.  Patient reports current dental discomfort is 1 out of 10. Patient states that the pain is getting better however reports that the area around the tooth is still red. Patient states he has a chronic history of dental infections especially around that same tooth. Denies other changes or other complaints.   Past Medical History  Diagnosis Date  . Umbilical hernia 2014    Patient Active Problem List   Diagnosis Date Noted  . Umbilical hernia 01/09/2013  . TOBACCO USE 09/07/2009  . HEMORRHOIDS 09/07/2009  . BACK PAIN 09/07/2009    Past Surgical History  Procedure Laterality Date  . Appendectomy  1990  . Hernia repair  2014    umbical     Current Outpatient Rx  Name  Route  Sig  Dispense  Refill  .             Allergies Review of patient's allergies indicates no known allergies.  History reviewed. No pertinent family history.  Social History Social History  Substance Use Topics  . Smoking status: Current Every Day Smoker -- 1.00 packs/day for 26 years  .  Smokeless tobacco: Never Used  . Alcohol Use: Yes     Comment: socially    Review of Systems Constitutional: No fever/chills Eyes: No visual changes. ENT: No sore throat. Positive right lower dental pain. Cardiovascular: Denies chest pain. Respiratory: Denies shortness of breath. Gastrointestinal: No abdominal pain.  No nausea, no vomiting.  No diarrhea.  No constipation. Genitourinary: Negative for dysuria. Musculoskeletal: Negative for back pain. Skin: Negative for rash. Neurological: Negative for headaches, focal weakness or numbness.  10-point ROS otherwise negative.  ____________________________________________   PHYSICAL EXAM:  VITAL SIGNS: ED Triage Vitals  Enc Vitals Group     BP 01/25/15 1427 147/87 mmHg     Pulse Rate 01/25/15 1427 94     Resp 01/25/15 1427 18     Temp 01/25/15 1427 98.4 F (36.9 C)     Temp Source 01/25/15 1427 Oral     SpO2 01/25/15 1427 99 %     Weight 01/25/15 1427 272 lb (123.378 kg)     Height 01/25/15 1427 5\' 9"  (1.753 m)     Head Cir --      Peak Flow --      Pain Score --      Pain Loc --      Pain Edu? --      Excl. in GC? --     Constitutional: Alert and oriented. Well appearing and in no acute distress. Eyes: Conjunctivae are normal. PERRL. EOMI.  Head: Atraumatic. No sinus tenderness to palpation. No erythema. Right face lateral to right mouth minimal swelling, no induration, no fluctuance, no drainage.  Ears: no erythema, normal TMs bilaterally.   Nose: No congestion/rhinnorhea.  Mouth/Throat: Mucous membranes are moist.  Oropharynx non-erythematous. No tonsillar swelling or exudate. No uvular deviation or shift. Widespread dental decay with multiple fractured teeth and multiple dental caries. Right lower dental tenderness along teeth #28-30 with moderate gum erythema and mild gum swelling. No palpable or visualized abscess. Neck: No stridor.  No cervical spine tenderness to palpation. No carotid  bruits Hematological/Lymphatic/Immunilogical: No cervical lymphadenopathy. Cardiovascular: Normal rate, regular rhythm. Grossly normal heart sounds.  Good peripheral circulation. Respiratory: Normal respiratory effort.  No retractions. Lungs CTAB. Gastrointestinal: Soft and nontender. Obese abdomen.  Normal Bowel sounds.  No abdominal bruits. No CVA tenderness. Musculoskeletal: No lower or upper extremity tenderness nor edema.  No cervical, thoracic or lumbar tenderness to palpation. Neurologic:  Normal speech and language. No gross focal neurologic deficits are appreciated. No gait instability. Skin:  Skin is warm, dry and intact. No rash noted. Psychiatric: Mood and affect are normal. Speech and behavior are normal.  ____________________________________________   LABS (all labs ordered are listed, but only abnormal results are displayed)  Labs Reviewed - No data to display   INITIAL IMPRESSION / ASSESSMENT AND PLAN / ED COURSE  Pertinent labs & imaging results that were available during my care of the patient were reviewed by me and considered in my medical decision making (see chart for details).  Very well-appearing patient. No acute distress. Reports continues to eat and drink well. Presents with complaint of right lower dental pain. Patient reports that right lower face was swollen earlier in the week and per patient report seems like patient had a dental abscess that spontaneously drained. No palpable or visualized dental abscess at this time. Patient with widespread dental decay and multiple fractured teeth and dental caries. Will treat patient with oral Pen-VK.Marland Kitchen Ibuprofen or Tylenol as needed for pain. Discussed with patient importance of following up with the Dentist..Local dental clinic information also given. Patient and wife state that patient does have dental insurance at this time and will follow-up with dentist this week. Discussed follow up with Primary care physician this  week. Discussed follow up and return parameters including no resolution or any worsening concerns. Patient verbalized understanding and agreed to plan.    ____________________________________________   FINAL CLINICAL IMPRESSION(S) / ED DIAGNOSES  Final diagnoses:  Pain, dental  Dental abscess       Renford Dills, NP 01/25/15 1532

## 2015-01-25 NOTE — Discharge Instructions (Signed)
Take medication as prescribed. Eat soft food diet as needed. Drink plenty of fluids. Follow-up with your primary care physician as needed. Follow-up with dentist this week. Return to urgent care as needed for new or worsening concerns.  Dental Abscess A dental abscess is a collection of pus in or around a tooth. CAUSES This condition is caused by a bacterial infection around the root of the tooth that involves the inner part of the tooth (pulp). It may result from:  Severe tooth decay.  Trauma to the tooth that allows bacteria to enter into the pulp, such as a broken or chipped tooth.  Severe gum disease around a tooth. SYMPTOMS Symptoms of this condition include:  Severe pain in and around the infected tooth.  Swelling and redness around the infected tooth, in the mouth, or in the face.  Tenderness.  Pus drainage.  Bad breath.  Bitter taste in the mouth.  Difficulty swallowing.  Difficulty opening the mouth.  Nausea.  Vomiting.  Chills.  Swollen neck glands.  Fever. DIAGNOSIS This condition is diagnosed with examination of the infected tooth. During the exam, your dentist may tap on the infected tooth. Your dentist will also ask about your medical and dental history and may order X-rays. TREATMENT This condition is treated by eliminating the infection. This may be done with:  Antibiotic medicine.  A root canal. This may be performed to save the tooth.  Pulling (extracting) the tooth. This may also involve draining the abscess. This is done if the tooth cannot be saved. HOME CARE INSTRUCTIONS  Take medicines only as directed by your dentist.  If you were prescribed antibiotic medicine, finish all of it even if you start to feel better.  Rinse your mouth (gargle) often with salt water to relieve pain or swelling.  Do not drive or operate heavy machinery while taking pain medicine.  Do not apply heat to the outside of your mouth.  Keep all follow-up visits  as directed by your dentist. This is important. SEEK MEDICAL CARE IF:  Your pain is worse and is not helped by medicine. SEEK IMMEDIATE MEDICAL CARE IF:  You have a fever or chills.  Your symptoms suddenly get worse.  You have a very bad headache.  You have problems breathing or swallowing.  You have trouble opening your mouth.  You have swelling in your neck or around your eye.   This information is not intended to replace advice given to you by your health care provider. Make sure you discuss any questions you have with your health care provider.   Document Released: 03/21/2005 Document Revised: 08/05/2014 Document Reviewed: 03/18/2014 Elsevier Interactive Patient Education 2016 Elsevier Inc.  Dental Pain Dental pain may be caused by many things, including:  Tooth decay (cavities or caries). Cavities expose the nerve of your tooth to air and hot or cold temperatures. This can cause pain or discomfort.  Abscess or infection. A dental abscess is a collection of infected pus from a bacterial infection in the inner part of the tooth (pulp). It usually occurs at the end of the tooth's root.  Injury.  An unknown reason (idiopathic). Your pain may be mild or severe. It may only occur when:  You are chewing.  You are exposed to hot or cold temperature.  You are eating or drinking sugary foods or beverages, such as soda or candy. Your pain may also be constant. HOME CARE INSTRUCTIONS Watch your dental pain for any changes. The following actions may help  to lessen any discomfort that you are feeling:  Take medicines only as directed by your dentist.  If you were prescribed an antibiotic medicine, finish all of it even if you start to feel better.  Keep all follow-up visits as directed by your dentist. This is important.  Do not apply heat to the outside of your face.  Rinse your mouth or gargle with salt water if directed by your dentist. This helps with pain and  swelling.  You can make salt water by adding  tsp of salt to 1 cup of warm water.  Apply ice to the painful area of your face:  Put ice in a plastic bag.  Place a towel between your skin and the bag.  Leave the ice on for 20 minutes, 2-3 times per day.  Avoid foods or drinks that cause you pain, such as:  Very hot or very cold foods or drinks.  Sweet or sugary foods or drinks. SEEK MEDICAL CARE IF:  Your pain is not controlled with medicines.  Your symptoms are worse.  You have new symptoms. SEEK IMMEDIATE MEDICAL CARE IF:  You are unable to open your mouth.  You are having trouble breathing or swallowing.  You have a fever.  Your face, neck, or jaw is swollen.   This information is not intended to replace advice given to you by your health care provider. Make sure you discuss any questions you have with your health care provider.   Document Released: 03/21/2005 Document Revised: 08/05/2014 Document Reviewed: 03/17/2014 Elsevier Interactive Patient Education 2016 [employer].   OPTIONS FOR DENTAL FOLLOW UP CARE  [place] Department of Health and Human Services - Local Safety Net Dental Clinics [email or web address]   [facility] [phone])  Sharl Ma [phone])  [facility] [phone])  [facility] [phone])  [facility] [phone]) This clinic caters to the indigent population and is on a lottery system. Location: [employer] of Dentistry, [employer], [address] [place] Clinic Hours: Wednesdays from 6pm - 9pm, patients seen by a lottery system. For dates, call or go to [email or web address] Services: Cleanings, fillings and simple extractions. Payment Options: DENTAL WORK IS FREE OF CHARGE. Bring proof of income or support. Best way to get seen: Arrive at 5:15 pm - this is a lottery, NOT  first come/first serve, so arriving earlier will not increase your chances of being seen.     [facility] [phone] Select option 1 for emergencies   Location: [facility] of Dentistry, [place], [address], [place] Clinic Hours: No walk-ins accepted - call the day before to schedule an appointment. Check in times are 9:30 am and 1:30 pm. Services: Simple extractions, temporary fillings, pulpectomy/pulp debridement, uncomplicated abscess drainage. Payment Options: PAYMENT IS DUE AT THE TIME OF SERVICE.  Fee is usually $100-200, additional surgical procedures (e.g. abscess drainage) may be extra. Cash, checks, Visa/MasterCard accepted.  Can file Medicaid if patient is covered for dental - patient should call case worker to check. No discount for [facility] patients. Best way to get seen: MUST call the day before and get onto the schedule. Can usually be seen the next 1-2 days. No walk-ins accepted.     [facility] [phone]   Location: [employer], [address] [place] Clinic Hours: M, W, Th, F 8am or 1:30pm, Tues 9a or 1:30 - first come/first served. Services: Simple extractions, temporary fillings,  uncomplicated abscess drainage.  You do not need to be an [facility] resident. Payment Options: PAYMENT IS DUE AT THE TIME OF SERVICE. Dental insurance, otherwise sliding scale - bring proof of income or support. Depending on income and treatment needed, cost is usually $50-200. Best way to get seen: Arrive early as it is first come/first served.     [facility] [phone]   Location: [address] Clinic Hours: Mon-Thu 8a-5p Services: Most basic dental services including extractions and fillings. Payment Options: PAYMENT IS DUE AT THE TIME OF SERVICE. Sliding scale, up to 50% off - bring proof if income or support. Medicaid with  dental option accepted. Best way to get seen: Call to schedule an appointment, can usually be seen within 2 weeks OR they will try to see walk-ins - show up at 8a or 2p (you may have to wait).     [facility] [phone] [place] RESIDENTS ONLY   Location: [facility], [address], [place], [place] [address] Clinic Hours: By appointment only. Monday - Thursday 8am-5pm, Friday 8am-12pm Services: Cleanings, fillings, extractions. Payment Options: PAYMENT IS DUE AT THE TIME OF SERVICE. Cash, Visa or MasterCard. Sliding scale - $30 minimum per service. Best way to get seen: Come in to office, complete packet and make an appointment - need proof of income or support monies for each household member and proof of [facility] residence. Usually takes about a month to get in.     [facility] [phone]   Location: [address], [facility] Clinic Hours: Walk-in Urgent Care Dental Services are offered Monday-Friday mornings only. The numbers of emergencies accepted daily is limited to the number of providers available. Maximum 15 - Mondays, Wednesdays & Thursdays Maximum 10 - Tuesdays & Fridays Services: You do not need to be a [facility] resident to be seen for a dental emergency. Emergencies are defined as pain, swelling, abnormal bleeding, or dental trauma. Walkins will receive x-rays if needed. NOTE: Dental cleaning is not an emergency. Payment Options: PAYMENT IS DUE AT THE TIME OF SERVICE. Minimum co-pay is $40.00 for uninsured patients. Minimum co-pay is $3.00 for Medicaid with dental coverage. Dental Insurance is accepted and must be presented at time of visit. Medicare does not cover dental. Forms of payment: Cash, credit card, checks. Best way to get seen: If not previously registered with the clinic, walk-in dental registration begins at 7:15 am and is on a first come/first serve basis. If  previously registered with the clinic, call to make an appointment.     [facility] [phone] [place] RESIDENTS ONLY   Location: [address] [place], [place] Clinic Hours: Mon-Thu 10a-2p Services: Extractions only! Payment Options: FREE (donations accepted) - bring proof of income or support Best way to get seen: Call and schedule an appointment OR come at 8am on the 1st Monday of every month (except for holidays) when it is first come/first served.     [facility] [phone]   Location: [address] [place], [place] Clinic Hours: Friday mornings Services, Payment Options, Best way to get seen: Call for info

## 2016-05-27 ENCOUNTER — Ambulatory Visit
Admission: EM | Admit: 2016-05-27 | Discharge: 2016-05-27 | Disposition: A | Discharge status: Home / Self Care | Payer: BLUE CROSS/BLUE SHIELD | Attending: Emergency Medicine | Admitting: Emergency Medicine

## 2016-05-27 ENCOUNTER — Encounter: Payer: Self-pay | Admitting: *Deleted

## 2016-05-27 DIAGNOSIS — Z024 Encounter for examination for driving license: Secondary | ICD-10-CM

## 2016-05-27 LAB — DEPT OF TRANSP DIPSTICK, URINE (ARMC ONLY)
Glucose, UA: NEGATIVE mg/dL
Hgb urine dipstick: NEGATIVE
Protein, ur: NEGATIVE mg/dL
Specific Gravity, Urine: 1.01 (ref 1.005–1.030)

## 2016-05-27 NOTE — ED Triage Notes (Signed)
Commercial drivers license physical exam 

## 2016-05-27 NOTE — ED Provider Notes (Signed)
CSN: [id number]     Arrival date & time 05/27/16  [id number] History   First MD Initiated Contact with Patient 05/27/16 1120     Chief Complaint  Patient presents with  . Commercial Driver's License Exam   (Consider location/radiation/quality/duration/timing/severity/associated sxs/prior Treatment) HPI   This 46 year old male presents for a DOT physical. Is no current problems. He is on no prescriptive medications. Surgical past history includes umbilical hernia repair and appendectomy and 2 fractured toes.      Past Medical History:  Diagnosis Date  . Umbilical hernia 2014   Past Surgical History:  Procedure Laterality Date  . APPENDECTOMY  1990  . HERNIA REPAIR  2014   umbical    History reviewed. No pertinent family history. Social History  Substance Use Topics  . Smoking status: Current Every Day Smoker    Packs/day: 1.00    Years: 26.00    Types: Cigarettes  . Smokeless tobacco: Never Used  . Alcohol use Yes     Comment: socially    Review of Systems  All other systems reviewed and are negative.   Allergies  Patient has no known allergies.  Home Medications   Prior to Admission medications   Medication Sig Start Date End Date Taking? Authorizing Provider  HYDROcodone-acetaminophen (NORCO/VICODIN) 5-325 MG per tablet Take 1 tablet by mouth as needed. 01/15/13   Historical Provider, MD  penicillin v potassium (VEETID) 500 MG tablet Take 1 tablet (500 mg total) by mouth 4 (four) times daily. 01/25/15   Renford DillsLindsey Miller, NP   Meds Ordered and Administered this Visit  Medications - No data to display  BP 133/72 (BP Location: Left Arm)   Pulse 70   Temp 98.1 F (36.7 C) (Oral)   Resp 15   Ht 5\' 10"  (1.778 m)   Wt 284 lb (128.8 kg)   SpO2 100%   BMI 40.75 kg/m  No data found.   Physical Exam  Constitutional:  Referred to DOT physical sheet  Nursing note and vitals reviewed.   Urgent Care Course     Procedures (including critical care time)  Labs  Review Labs Reviewed  DEPT OF TRANSP DIPSTICK, URINE([facility] ONLY)    Imaging Review No results found.   Visual Acuity Review  Right Eye Distance:   Left Eye Distance:   Bilateral Distance:    Right Eye Near:   Left Eye Near:    Bilateral Near:         MDM   1. Driver's permit PE (physical examination)    Patient qualifies for 2 year certificate.    Lutricia FeilWilliam P Roemer, PA-C 05/27/16 1142

## 2017-07-17 ENCOUNTER — Encounter: Payer: Self-pay | Admitting: Emergency Medicine

## 2017-07-17 ENCOUNTER — Other Ambulatory Visit: Payer: Self-pay

## 2017-07-17 ENCOUNTER — Ambulatory Visit
Admission: EM | Admit: 2017-07-17 | Discharge: 2017-07-17 | Disposition: A | Discharge status: Home / Self Care | Payer: Self-pay | Attending: Family Medicine | Admitting: Family Medicine

## 2017-07-17 DIAGNOSIS — H9221 Otorrhagia, right ear: Secondary | ICD-10-CM

## 2017-07-17 NOTE — Discharge Instructions (Signed)
Appt upstairs at 11 am.  Arrive at 1030.  Take care  Dr. Adriana Simasook

## 2017-07-17 NOTE — ED Provider Notes (Signed)
[facility]    CSN: [id number] Arrival date & time: 07/17/17  0813  History   Chief Complaint Chief Complaint  Patient presents with  . blood in ear    right   HPI  47 year old male presents with the above complaint.  Patient states that he felt something fly into his right ear yesterday.  He presumed it was a bug.  He states that after this occurred, he used a Q-tip and noted that there was blood on it.  He also used a cotton ball and also noted blood on that as well.  He states that he has had some pain.  Some mild decrease in hearing.  He is not sure if there is a bug still present or not.  No other interventions tried.  No known exacerbating factors.  No other associated symptoms.  No other complaints.  Past Medical History:  Diagnosis Date  . Umbilical hernia 2014    Patient Active Problem List   Diagnosis Date Noted  . Umbilical hernia 01/09/2013  . TOBACCO USE 09/07/2009  . HEMORRHOIDS 09/07/2009  . BACK PAIN 09/07/2009    Past Surgical History:  Procedure Laterality Date  . APPENDECTOMY  1990  . HERNIA REPAIR  2014   umbical     Home Medications    Prior to Admission medications   Medication Sig Start Date End Date Taking? Authorizing Provider  fluticasone (FLONASE) 50 MCG/ACT nasal spray Place 1 spray into both nostrils daily as needed. 04/09/17  Yes [provider]    Family History History reviewed. No pertinent family history.  Social History Social History   Tobacco Use  . Smoking status: Current Every Day Smoker    Packs/day: 1.00    Years: 26.00    Pack years: 26.00    Types: Cigarettes  . Smokeless tobacco: Never Used  Substance Use Topics  . Alcohol use: Yes    Comment: socially  . Drug use: No    Allergies   Patient has no known allergies.   Review of Systems Review of Systems  Constitutional: Negative.   HENT: Positive for hearing loss.        Blood from ear.   Physical Exam Triage Vital Signs ED Triage  Vitals  Enc Vitals Group     BP 07/17/17 0828 136/88     Pulse Rate 07/17/17 0828 82     Resp 07/17/17 0828 16     Temp 07/17/17 0828 98.3 F (36.8 C)     Temp Source 07/17/17 0828 Oral     SpO2 07/17/17 0828 97 %     Weight 07/17/17 0829 272 lb (123.4 kg)     Height 07/17/17 0829 5' 9.5" (1.765 m)     Head Circumference --      Peak Flow --      Pain Score 07/17/17 0828 0     Pain Loc --      Pain Edu? --      Excl. in GC? --    Updated Vital Signs BP 136/88 (BP Location: Left Arm)   Pulse 82   Temp 98.3 F (36.8 C) (Oral)   Resp 16   Ht 5' 9.5" (1.765 m)   Wt 272 lb (123.4 kg)   SpO2 97%   BMI 39.59 kg/m  Physical Exam  Constitutional: He is oriented to person, place, and time. He appears well-developed. No distress.  HENT:  Left canal and TM normal. Right canal with bright red blood; there is  an area of blood at the TM as well although I cannot appreciate a discrete perforation.  Cardiovascular: Normal rate and regular rhythm.  Pulmonary/Chest: Effort normal and breath sounds normal.  Neurological: He is alert and oriented to person, place, and time.  Psychiatric: He has a normal mood and affect. His behavior is normal.  Nursing note and vitals reviewed.  UC Treatments / Results  Labs (all labs ordered are listed, but only abnormal results are displayed) Labs Reviewed - No data to display  EKG None Radiology No results found.  Procedures Procedures (including critical care time)  Medications Ordered in UC Medications - No data to display   Initial Impression / Assessment and Plan / UC Course  I have reviewed the triage vital signs and the nursing notes.  Pertinent labs & imaging results that were available during my care of the patient were reviewed by me and considered in my medical decision making (see chart for details).    47 year old male presents with bleeding from his right ear.  I am unsure if he has had a perforation or not.  Sending to ENT.   Appointment scheduled for today at 11:00.  Final Clinical Impressions(s) / UC Diagnoses   Final diagnoses:  Bleeding from right ear    ED Discharge Orders    None     Controlled Substance Prescriptions [place] Controlled Substance Registry consulted? Not Applicable   Tommie Sams, [place] 07/17/17 [id number]

## 2017-07-17 NOTE — ED Triage Notes (Signed)
Patient in today c/o blood in his right ear since yesterday. Patient states that a bug flew in his ear yesterday and used a Qtip to try and remove the bug and it had blood on it. Patient unsure if there is a bug in his ear or not. Patient denies any pain.

## 2020-04-29 ENCOUNTER — Ambulatory Visit: Payer: Self-pay

## 2020-09-02 DEATH — deceased
